# Patient Record
Sex: Female | Born: 2000 | Race: White | Hispanic: Yes | Marital: Single | Smoking: Never smoker
Health system: Southern US, Community
[De-identification: ages and names within clinical notes are randomized; demographics above are authoritative.]

## PROBLEM LIST (undated history)

## (undated) DIAGNOSIS — K5900 Constipation, unspecified: Secondary | ICD-10-CM

---
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
## 2020-01-14 ENCOUNTER — Ambulatory Visit: Payer: Medicaid Other | Attending: Internal Medicine

## 2020-01-14 DIAGNOSIS — Z23 Encounter for immunization: Secondary | ICD-10-CM

## 2020-01-14 NOTE — Progress Notes (Signed)
   Covid-19 Vaccination Clinic  Name:  Mischelle Reeg    MRN: [id number] DOB: Aug 29, 2001  01/14/2020  Ms. Seltzer was observed post Covid-19 immunization for 15 minutes  without incident. She was provided with Vaccine Information Sheet and instruction to access the V-Safe system.   Ms. Hedding was instructed to call 911 with any severe reactions post vaccine: Marland Kitchen Difficulty breathing  . Swelling of face and throat  . A fast heartbeat  . A bad rash all over body  . Dizziness and weakness   Immunizations Administered    Name Date Dose VIS Date Route   Pfizer COVID-19 Vaccine 01/14/2020 12:03 PM 0.3 mL 11/12/2018 Intramuscular   Manufacturer: [employer], [employer]   Lot: [id number]   NDC: [address]-8

## 2023-07-02 ENCOUNTER — Emergency Department (HOSPITAL_BASED_OUTPATIENT_CLINIC_OR_DEPARTMENT_OTHER)
Admission: EM | Admit: 2023-07-02 | Discharge: 2023-07-02 | Disposition: A | Discharge status: Home / Self Care | Payer: Medicaid Other | Attending: Emergency Medicine | Admitting: Emergency Medicine

## 2023-07-02 ENCOUNTER — Other Ambulatory Visit: Payer: Self-pay

## 2023-07-02 ENCOUNTER — Encounter (HOSPITAL_BASED_OUTPATIENT_CLINIC_OR_DEPARTMENT_OTHER): Payer: Self-pay | Admitting: Emergency Medicine

## 2023-07-02 DIAGNOSIS — T148XXA Other injury of unspecified body region, initial encounter: Secondary | ICD-10-CM

## 2023-07-02 DIAGNOSIS — Z23 Encounter for immunization: Secondary | ICD-10-CM | POA: Diagnosis not present

## 2023-07-02 DIAGNOSIS — S6992XA Unspecified injury of left wrist, hand and finger(s), initial encounter: Secondary | ICD-10-CM | POA: Diagnosis present

## 2023-07-02 DIAGNOSIS — W268XXA Contact with other sharp object(s), not elsewhere classified, initial encounter: Secondary | ICD-10-CM | POA: Insufficient documentation

## 2023-07-02 DIAGNOSIS — S60417A Abrasion of left little finger, initial encounter: Secondary | ICD-10-CM | POA: Insufficient documentation

## 2023-07-02 MED ORDER — TETANUS-DIPHTH-ACELL PERTUSSIS 5-2.5-18.5 LF-MCG/0.5 IM SUSY
0.5000 mL | PREFILLED_SYRINGE | Freq: Once | INTRAMUSCULAR | Status: AC
  Administered 2023-07-02: 0.5 mL via INTRAMUSCULAR
  Filled 2023-07-02: qty 0.5

## 2023-07-02 NOTE — ED Triage Notes (Signed)
Abrasion to base of left fifth digit 2 days ago , scratched by a rusty metal pole .  Here for tetanus shot

## 2023-07-02 NOTE — ED Provider Notes (Signed)
   EMERGENCY DEPARTMENT AT [facility] Provider Note   CSN: [id number] Arrival date & time: 07/02/23  1216     History  Chief Complaint  Patient presents with   Abrasion    Deborah Hood is a 22 y.o. female.  Patient here with a shot needed.  Got scratched by a rusty pole 2 days ago on her left hand.  She denies any fevers or chills.  She is on some local wound care with hydroperoxide.  She has not noticed any redness or swelling.  She states that she needs her tetanus shot.  She denies any pain.  No fever chills or other systemic symptoms.  No significant medical problems.  The history is provided by the patient.       Home Medications Prior to Admission medications   Medication Sig Start Date End Date Taking? Authorizing Provider  polyethylene glycol (MIRALAX / GLYCOLAX) packet Take 17 g by mouth daily.    [provider]      Allergies    Patient has no known allergies.    Review of Systems   Review of Systems  Physical Exam Updated Vital Signs BP 102/63   Pulse 63   Temp 97.8 F (36.6 C) (Oral)   Resp 14   Wt 59.9 kg   LMP 06/20/2023 (Exact Date)   SpO2 95%  Physical Exam Cardiovascular:     Pulses: Normal pulses.  Musculoskeletal:        General: No swelling or tenderness. Normal range of motion.  Skin:    General: Skin is warm.     Findings: No erythema.     Comments: Abrasion to the base of the left pinky finger on the palmar side that is about 1 cm and superficial, no redness drainage or fluctuance  Neurological:     Mental Status: She is alert.     Sensory: No sensory deficit.     Motor: No weakness.     ED Results / Procedures / Treatments   Labs (all labs ordered are listed, but only abnormal results are displayed) Labs Reviewed - No data to display  EKG None  Radiology No results found.  Procedures Procedures    Medications Ordered in ED Medications  Tdap (BOOSTRIX) injection 0.5 mL (has no  administration in time range)    ED Course/ Medical Decision Making/ A&P                                 Medical Decision Making Risk Prescription drug management.   Rieley Khalsa is here with need for tetanus shot.  Patient has abrasion to be base of the pinky finger on the left.  This is on the palmar side.  Wound is superficial.  I have no concern for infectious process or traumatic process at this time.  Recommend conservatively bacitracin or Neosporin twice daily.  Tetanus shot was updated.  Patient discharged in good condition.  Neurovascular neuromuscular intact.  Understands return precautions.  Discharged.  This chart was dictated using voice recognition software.  Despite best efforts to proofread,  errors can occur which can change the documentation meaning.         Final Clinical Impression(s) / ED Diagnoses Final diagnoses:  Abrasion    Rx / DC Orders ED Discharge Orders     None         Virgina Norfolk, DO 07/02/23 1425

## 2023-07-02 NOTE — Discharge Instructions (Signed)
Recommend using over-the-counter bacitracin or Neosporin twice daily to help prevent infection for the next couple days.  Your tetanus shot has been updated.

## 2023-11-08 ENCOUNTER — Encounter (HOSPITAL_COMMUNITY): Payer: Self-pay | Admitting: Emergency Medicine

## 2023-11-08 ENCOUNTER — Telehealth (HOSPITAL_COMMUNITY): Payer: Self-pay | Admitting: *Deleted

## 2023-11-08 ENCOUNTER — Other Ambulatory Visit (HOSPITAL_BASED_OUTPATIENT_CLINIC_OR_DEPARTMENT_OTHER): Payer: Self-pay

## 2023-11-08 ENCOUNTER — Ambulatory Visit (HOSPITAL_COMMUNITY)
Admission: EM | Admit: 2023-11-08 | Discharge: 2023-11-08 | Disposition: A | Discharge status: Home / Self Care | Payer: Medicaid Other | Attending: Nurse Practitioner | Admitting: Nurse Practitioner

## 2023-11-08 DIAGNOSIS — S6991XA Unspecified injury of right wrist, hand and finger(s), initial encounter: Secondary | ICD-10-CM

## 2023-11-08 DIAGNOSIS — S6992XA Unspecified injury of left wrist, hand and finger(s), initial encounter: Secondary | ICD-10-CM

## 2023-11-08 MED ORDER — MUPIROCIN 2 % EX OINT: 1.0000 | TOPICAL_OINTMENT | Freq: Two times a day (BID) | CUTANEOUS | 0 refills | Status: AC

## 2023-11-08 MED ORDER — MUPIROCIN 2 % EX OINT
1.0000 | TOPICAL_OINTMENT | Freq: Two times a day (BID) | CUTANEOUS | 0 refills | Status: DC
  Filled 2023-11-08: qty 22, 11d supply, fill #0

## 2023-11-08 NOTE — ED Triage Notes (Signed)
Pt reports got acrylic on her nails week or so ago. Pt reports that acrylic is starting to come up at the nail bed end (on some nails) causing pain, sometimes bleeding and swelling. 5th right nail and 4th left finger nail. Hasn't contacted the nail salon who did her nails.

## 2023-11-08 NOTE — Discharge Instructions (Signed)
Recommend going to the nail salon where he had her nails done to have the acrylic nails removed after leaving urgent care today.  You can apply the Bactroban ointment to the nailbeds twice daily as needed to help with infection.  Recommend warm soaks as needed to help with pain and swelling.  Seek care if symptoms worsen despite treatment.

## 2023-11-08 NOTE — ED Provider Notes (Signed)
[facility]    CSN: [id number] Arrival date & time: 11/08/23  1346      History   Chief Complaint Chief Complaint  Patient presents with   Nail Problem    HPI Deborah Hood is a 23 y.o. female.   Patient presents today for right fifth digit nail pain and left fourth digit nail pain that began a few days ago.  She denies recent trauma or known injury to the nails.  She does have acrylic nails on and thinks they are being pulled off, causing damage to the nails underneath.  She denies any drainage from the nails or swelling/redness.  No fevers or nausea/vomiting.  She has taken ibuprofen which helped with the pain minimally.  Has also been applying Vaseline and gauze which does not help very much.    Past Medical History:  Diagnosis Date   Constipation     There are no active problems to display for this patient.   History reviewed. No pertinent surgical history.  OB History   No obstetric history on file.      Home Medications    Prior to Admission medications   Medication Sig Start Date End Date Taking? Authorizing Provider  mupirocin ointment (BACTROBAN) 2 % Apply 1 Application topically 2 (two) times daily. 11/08/23   Valentino Nose, NP  polyethylene glycol (MIRALAX / GLYCOLAX) packet Take 17 g by mouth daily.    [provider]    Family History No family history on file.  Social History Social History   Tobacco Use   Smoking status: Never   Smokeless tobacco: Never  Substance Use Topics   Alcohol use: No   Drug use: No     Allergies   Patient has no known allergies.   Review of Systems Review of Systems Per HPI  Physical Exam Triage Vital Signs ED Triage Vitals [11/08/23 1521]  Encounter Vitals Group     BP 114/72     Systolic BP Percentile      Diastolic BP Percentile      Pulse Rate 72     Resp 17     Temp 98.2 F (36.8 C)     Temp Source Oral     SpO2 98 %     Weight      Height      Head Circumference       Peak Flow      Pain Score 10     Pain Loc      Pain Education      Exclude from Growth Chart    No data found.  Updated Vital Signs BP 114/72 (BP Location: Right Arm)   Pulse 72   Temp 98.2 F (36.8 C) (Oral)   Resp 17   LMP 11/02/2023   SpO2 98%   Visual Acuity Right Eye Distance:   Left Eye Distance:   Bilateral Distance:    Right Eye Near:   Left Eye Near:    Bilateral Near:     Physical Exam Vitals and nursing note reviewed.  Constitutional:      General: She is not in acute distress.    Appearance: Normal appearance. She is not toxic-appearing.  HENT:     Head: Normocephalic and atraumatic.     Mouth/Throat:     Mouth: Mucous membranes are moist.     Pharynx: Oropharynx is clear.  Eyes:     General: No scleral icterus.    Extraocular Movements: Extraocular movements intact.  Pulmonary:     Effort: Pulmonary effort is normal. No respiratory distress.  Skin:    General: Skin is warm and dry.     Capillary Refill: Capillary refill takes less than 2 seconds.     Coloration: Skin is not jaundiced or pale.     Findings: No erythema.     Comments: Acrylic nails noted to all 5 digits.  No surrounding erythema, paronychia, warmth, swelling.  Right fifth and left fourth digits are tender to palpation distally.  She is distally neurovascularly intact in all 10 upper extremity digits.  No active drainage.  Neurological:     Mental Status: She is alert and oriented to person, place, and time.  Psychiatric:        Behavior: Behavior is cooperative.      UC Treatments / Results  Labs (all labs ordered are listed, but only abnormal results are displayed) Labs Reviewed - No data to display  EKG   Radiology No results found.  Procedures Procedures (including critical care time)  Medications Ordered in UC Medications - No data to display  Initial Impression / Assessment and Plan / UC Course  I have reviewed the triage vital signs and the nursing  notes.  Pertinent labs & imaging results that were available during my care of the patient were reviewed by me and considered in my medical decision making (see chart for details).   Patient is well-appearing, normotensive, afebrile, not tachycardic, not tachypneic, oxygenating well on room air.    1.  Injury to fingernail of right hand, initial encounter 2. Injury to fingernail of left hand, initial encounter Recommend removal of acrylic nails at nail salon today Recommended avoidance of acrylic nails in the future Start Bactroban ointment twice daily as needed if there are underlying nailbed wounds Return and ER precautions discussed with patient  The patient was given the opportunity to ask questions.  All questions answered to their satisfaction.  The patient is in agreement to this plan.    Final Clinical Impressions(s) / UC Diagnoses   Final diagnoses:  Injury to fingernail of right hand, initial encounter  Injury to fingernail of left hand, initial encounter     Discharge Instructions      Recommend going to the nail salon where he had her nails done to have the acrylic nails removed after leaving urgent care today.  You can apply the Bactroban ointment to the nailbeds twice daily as needed to help with infection.  Recommend warm soaks as needed to help with pain and swelling.  Seek care if symptoms worsen despite treatment.    ED Prescriptions     Medication Sig Dispense Auth. Provider   mupirocin ointment (BACTROBAN) 2 % Apply 1 Application topically 2 (two) times daily. 22 g Valentino Nose, NP      PDMP not reviewed this encounter.   Valentino Nose, NP 11/08/23 [phone]
